# Patient Record
Sex: Female | Born: 1970 | Race: White | Hispanic: No | Marital: Married | Smoking: Current every day smoker
Health system: Southern US, Community
[De-identification: ages and names within clinical notes are randomized; demographics above are authoritative.]

## PROBLEM LIST (undated history)

## (undated) DIAGNOSIS — M810 Age-related osteoporosis without current pathological fracture: Secondary | ICD-10-CM

## (undated) DIAGNOSIS — F319 Bipolar disorder, unspecified: Secondary | ICD-10-CM

## (undated) DIAGNOSIS — F329 Major depressive disorder, single episode, unspecified: Secondary | ICD-10-CM

## (undated) DIAGNOSIS — C801 Malignant (primary) neoplasm, unspecified: Secondary | ICD-10-CM

## (undated) DIAGNOSIS — M5030 Other cervical disc degeneration, unspecified cervical region: Secondary | ICD-10-CM

## (undated) DIAGNOSIS — J449 Chronic obstructive pulmonary disease, unspecified: Secondary | ICD-10-CM

## (undated) DIAGNOSIS — M797 Fibromyalgia: Secondary | ICD-10-CM

## (undated) DIAGNOSIS — Z9889 Other specified postprocedural states: Secondary | ICD-10-CM

## (undated) DIAGNOSIS — I10 Essential (primary) hypertension: Secondary | ICD-10-CM

## (undated) DIAGNOSIS — F32A Depression, unspecified: Secondary | ICD-10-CM

## (undated) HISTORY — PX: ABDOMINAL HYSTERECTOMY: SHX81

## (undated) HISTORY — PX: SKIN BIOPSY: SHX1

## (undated) HISTORY — PX: CHOLECYSTECTOMY: SHX55

## (undated) HISTORY — PX: BACK SURGERY: SHX140

---

## 2000-06-03 ENCOUNTER — Inpatient Hospital Stay (HOSPITAL_COMMUNITY): Admission: EM | Admit: 2000-06-03 | Discharge: 2000-06-06 | Payer: Self-pay | Admitting: *Deleted

## 2001-11-04 ENCOUNTER — Emergency Department (HOSPITAL_COMMUNITY): Admission: EM | Admit: 2001-11-04 | Discharge: 2001-11-04 | Payer: Self-pay | Admitting: Emergency Medicine

## 2003-01-25 ENCOUNTER — Encounter: Payer: Self-pay | Admitting: General Surgery

## 2003-01-26 ENCOUNTER — Encounter: Payer: Self-pay | Admitting: General Surgery

## 2003-01-26 ENCOUNTER — Inpatient Hospital Stay (HOSPITAL_COMMUNITY): Admission: AC | Admit: 2003-01-26 | Discharge: 2003-01-30 | Payer: Self-pay

## 2003-01-27 ENCOUNTER — Encounter: Payer: Self-pay | Admitting: General Surgery

## 2003-01-28 ENCOUNTER — Encounter: Payer: Self-pay | Admitting: General Surgery

## 2003-06-17 ENCOUNTER — Encounter: Payer: Self-pay | Admitting: Emergency Medicine

## 2003-06-17 ENCOUNTER — Emergency Department (HOSPITAL_COMMUNITY): Admission: EM | Admit: 2003-06-17 | Discharge: 2003-06-17 | Payer: Self-pay | Admitting: Obstetrics and Gynecology

## 2004-01-13 ENCOUNTER — Ambulatory Visit (HOSPITAL_COMMUNITY): Admission: RE | Admit: 2004-01-13 | Discharge: 2004-01-13 | Payer: Self-pay | Admitting: Family Medicine

## 2006-06-25 ENCOUNTER — Emergency Department (HOSPITAL_COMMUNITY): Admission: EM | Admit: 2006-06-25 | Discharge: 2006-06-25 | Payer: Self-pay | Admitting: Emergency Medicine

## 2006-06-27 ENCOUNTER — Emergency Department (HOSPITAL_COMMUNITY): Admission: EM | Admit: 2006-06-27 | Discharge: 2006-06-27 | Payer: Self-pay | Admitting: Emergency Medicine

## 2006-06-29 ENCOUNTER — Emergency Department (HOSPITAL_COMMUNITY): Admission: EM | Admit: 2006-06-29 | Discharge: 2006-06-29 | Payer: Self-pay | Admitting: Emergency Medicine

## 2006-10-07 ENCOUNTER — Inpatient Hospital Stay (HOSPITAL_COMMUNITY): Admission: EM | Admit: 2006-10-07 | Discharge: 2006-10-09 | Payer: Self-pay | Admitting: Emergency Medicine

## 2007-02-09 ENCOUNTER — Emergency Department (HOSPITAL_COMMUNITY): Admission: EM | Admit: 2007-02-09 | Discharge: 2007-02-09 | Payer: Self-pay | Admitting: Emergency Medicine

## 2007-02-15 ENCOUNTER — Ambulatory Visit: Payer: Self-pay | Admitting: Internal Medicine

## 2007-03-20 ENCOUNTER — Ambulatory Visit: Payer: Self-pay | Admitting: Internal Medicine

## 2007-05-22 ENCOUNTER — Encounter: Payer: Self-pay | Admitting: Obstetrics & Gynecology

## 2007-05-22 ENCOUNTER — Observation Stay (HOSPITAL_COMMUNITY): Admission: RE | Admit: 2007-05-22 | Discharge: 2007-05-23 | Payer: Self-pay | Admitting: Obstetrics & Gynecology

## 2007-12-26 ENCOUNTER — Encounter: Admission: RE | Admit: 2007-12-26 | Discharge: 2007-12-26 | Payer: Self-pay | Admitting: Neurosurgery

## 2008-01-30 ENCOUNTER — Encounter (HOSPITAL_COMMUNITY): Admission: RE | Admit: 2008-01-30 | Discharge: 2008-02-29 | Payer: Self-pay | Admitting: Neurosurgery

## 2008-04-06 ENCOUNTER — Emergency Department (HOSPITAL_COMMUNITY): Admission: EM | Admit: 2008-04-06 | Discharge: 2008-04-06 | Payer: Self-pay | Admitting: Emergency Medicine

## 2008-06-23 ENCOUNTER — Inpatient Hospital Stay (HOSPITAL_COMMUNITY): Admission: EM | Admit: 2008-06-23 | Discharge: 2008-06-24 | Payer: Self-pay | Admitting: Emergency Medicine

## 2008-10-28 ENCOUNTER — Emergency Department (HOSPITAL_COMMUNITY): Admission: EM | Admit: 2008-10-28 | Discharge: 2008-10-28 | Payer: Self-pay | Admitting: Emergency Medicine

## 2009-03-06 ENCOUNTER — Inpatient Hospital Stay (HOSPITAL_COMMUNITY): Admission: AD | Admit: 2009-03-06 | Discharge: 2009-03-09 | Payer: Self-pay | Admitting: Internal Medicine

## 2009-03-06 ENCOUNTER — Ambulatory Visit: Payer: Self-pay | Admitting: Internal Medicine

## 2009-04-16 ENCOUNTER — Emergency Department (HOSPITAL_COMMUNITY): Admission: EM | Admit: 2009-04-16 | Discharge: 2009-04-16 | Payer: Self-pay | Admitting: Emergency Medicine

## 2009-05-19 ENCOUNTER — Emergency Department (HOSPITAL_COMMUNITY): Admission: EM | Admit: 2009-05-19 | Discharge: 2009-05-19 | Payer: Self-pay | Admitting: Emergency Medicine

## 2009-05-30 ENCOUNTER — Emergency Department (HOSPITAL_COMMUNITY): Admission: EM | Admit: 2009-05-30 | Discharge: 2009-05-30 | Payer: Self-pay | Admitting: Emergency Medicine

## 2009-09-17 ENCOUNTER — Ambulatory Visit (HOSPITAL_COMMUNITY): Admission: RE | Admit: 2009-09-17 | Discharge: 2009-09-17 | Payer: Self-pay | Admitting: Internal Medicine

## 2009-11-26 ENCOUNTER — Encounter: Payer: Self-pay | Admitting: Internal Medicine

## 2009-12-13 ENCOUNTER — Ambulatory Visit (HOSPITAL_COMMUNITY): Admission: RE | Admit: 2009-12-13 | Discharge: 2009-12-13 | Payer: Self-pay | Admitting: Neurology

## 2010-06-09 ENCOUNTER — Ambulatory Visit: Payer: Self-pay | Admitting: Cardiology

## 2010-06-15 ENCOUNTER — Ambulatory Visit (HOSPITAL_COMMUNITY): Payer: Self-pay | Admitting: Oncology

## 2010-06-15 ENCOUNTER — Encounter (HOSPITAL_COMMUNITY)
Admission: RE | Admit: 2010-06-15 | Discharge: 2010-07-15 | Payer: Self-pay | Source: Home / Self Care | Admitting: Oncology

## 2010-06-16 ENCOUNTER — Ambulatory Visit (HOSPITAL_COMMUNITY): Payer: Self-pay | Admitting: Oncology

## 2010-06-20 ENCOUNTER — Inpatient Hospital Stay (HOSPITAL_COMMUNITY): Admission: EM | Admit: 2010-06-20 | Discharge: 2010-06-23 | Payer: Self-pay | Admitting: Emergency Medicine

## 2010-06-27 ENCOUNTER — Ambulatory Visit (HOSPITAL_COMMUNITY): Admission: RE | Admit: 2010-06-27 | Discharge: 2010-06-27 | Payer: Self-pay | Admitting: Oncology

## 2010-07-25 ENCOUNTER — Ambulatory Visit (HOSPITAL_COMMUNITY): Admission: RE | Admit: 2010-07-25 | Discharge: 2010-07-25 | Payer: Self-pay | Admitting: Neurology

## 2010-09-13 ENCOUNTER — Ambulatory Visit (HOSPITAL_COMMUNITY): Payer: Self-pay | Admitting: Oncology

## 2010-09-13 ENCOUNTER — Encounter (HOSPITAL_COMMUNITY)
Admission: RE | Admit: 2010-09-13 | Discharge: 2010-10-13 | Payer: Self-pay | Source: Home / Self Care | Attending: Oncology | Admitting: Oncology

## 2010-09-29 ENCOUNTER — Ambulatory Visit
Admission: RE | Admit: 2010-09-29 | Discharge: 2010-09-29 | Payer: Self-pay | Source: Home / Self Care | Attending: Neurology | Admitting: Neurology

## 2010-11-06 ENCOUNTER — Encounter (HOSPITAL_COMMUNITY): Payer: Self-pay | Admitting: Oncology

## 2010-11-07 ENCOUNTER — Ambulatory Visit: Admit: 2010-11-07 | Payer: Self-pay | Admitting: Internal Medicine

## 2010-11-15 NOTE — Letter (Signed)
Summary: DISABILITY CLAIM  DISABILITY CLAIM   Imported By: Diana Eves 11/26/2009 15:10:24  _____________________________________________________________________  External Attachment:    Type:   Image     Comment:   External Document

## 2010-12-28 ENCOUNTER — Emergency Department (HOSPITAL_COMMUNITY)
Admission: EM | Admit: 2010-12-28 | Discharge: 2010-12-28 | Disposition: A | Discharge status: Home / Self Care | Payer: Self-pay | Attending: Emergency Medicine | Admitting: Emergency Medicine

## 2010-12-28 DIAGNOSIS — R110 Nausea: Secondary | ICD-10-CM | POA: Insufficient documentation

## 2010-12-28 DIAGNOSIS — R21 Rash and other nonspecific skin eruption: Secondary | ICD-10-CM | POA: Insufficient documentation

## 2010-12-28 DIAGNOSIS — L089 Local infection of the skin and subcutaneous tissue, unspecified: Secondary | ICD-10-CM | POA: Insufficient documentation

## 2010-12-28 DIAGNOSIS — R197 Diarrhea, unspecified: Secondary | ICD-10-CM | POA: Insufficient documentation

## 2010-12-28 DIAGNOSIS — Z79899 Other long term (current) drug therapy: Secondary | ICD-10-CM | POA: Insufficient documentation

## 2010-12-29 LAB — CBC
HCT: 39 % (ref 36.0–46.0)
HCT: 39.6 % (ref 36.0–46.0)
HCT: 41.1 % (ref 36.0–46.0)
Hemoglobin: 12.9 g/dL (ref 12.0–15.0)
Hemoglobin: 13.5 g/dL (ref 12.0–15.0)
Hemoglobin: 13.9 g/dL (ref 12.0–15.0)
MCH: 32.2 pg (ref 26.0–34.0)
MCH: 32.3 pg (ref 26.0–34.0)
MCH: 32.5 pg (ref 26.0–34.0)
MCHC: 33.6 g/dL (ref 30.0–36.0)
MCHC: 33.8 g/dL (ref 30.0–36.0)
MCHC: 34.1 g/dL (ref 30.0–36.0)
MCHC: 34.5 g/dL (ref 30.0–36.0)
MCV: 94.5 fL (ref 78.0–100.0)
MCV: 95.5 fL (ref 78.0–100.0)
Platelets: 274 10*3/uL (ref 150–400)
Platelets: 284 K/uL (ref 150–400)
RBC: 3.96 MIL/uL (ref 3.87–5.11)
RBC: 4.19 MIL/uL (ref 3.87–5.11)
RDW: 14.2 % (ref 11.5–15.5)
RDW: 14.4 % (ref 11.5–15.5)
RDW: 14.5 % (ref 11.5–15.5)
RDW: 14.7 % (ref 11.5–15.5)
WBC: 10.1 10*3/uL (ref 4.0–10.5)
WBC: 10.9 K/uL — ABNORMAL HIGH (ref 4.0–10.5)
WBC: 11.1 10*3/uL — ABNORMAL HIGH (ref 4.0–10.5)

## 2010-12-29 LAB — BLOOD GAS, ARTERIAL
Acid-Base Excess: 8.4 mmol/L — ABNORMAL HIGH (ref 0.0–2.0)
Bicarbonate: 33.5 meq/L — ABNORMAL HIGH (ref 20.0–24.0)
FIO2: 0.21 %
O2 Saturation: 92.3 %
Patient temperature: 37
TCO2: 30 mmol/L (ref 0–100)
pCO2 arterial: 56.7 mmHg — ABNORMAL HIGH (ref 35.0–45.0)
pH, Arterial: 7.389 (ref 7.350–7.400)
pO2, Arterial: 58 mmHg — ABNORMAL LOW (ref 80.0–100.0)

## 2010-12-29 LAB — DIFFERENTIAL
Basophils Absolute: 0.1 10*3/uL (ref 0.0–0.1)
Basophils Absolute: 0.1 10*3/uL (ref 0.0–0.1)
Basophils Absolute: 0.2 K/uL — ABNORMAL HIGH (ref 0.0–0.1)
Basophils Relative: 1 % (ref 0–1)
Basophils Relative: 1 % (ref 0–1)
Basophils Relative: 2 % — ABNORMAL HIGH (ref 0–1)
Eosinophils Absolute: 0 10*3/uL (ref 0.0–0.7)
Eosinophils Absolute: 0.5 K/uL (ref 0.0–0.7)
Eosinophils Relative: 5 % (ref 0–5)
Eosinophils Relative: 5 % (ref 0–5)
Eosinophils Relative: 5 % (ref 0–5)
Lymphocytes Relative: 19 % (ref 12–46)
Lymphocytes Relative: 41 % (ref 12–46)
Lymphocytes Relative: 41 % (ref 12–46)
Lymphocytes Relative: 52 % — ABNORMAL HIGH (ref 12–46)
Lymphs Abs: 2.1 10*3/uL (ref 0.7–4.0)
Lymphs Abs: 3.8 10*3/uL (ref 0.7–4.0)
Lymphs Abs: 4.4 K/uL — ABNORMAL HIGH (ref 0.7–4.0)
Monocytes Absolute: 0.4 10*3/uL (ref 0.1–1.0)
Monocytes Absolute: 0.5 10*3/uL (ref 0.1–1.0)
Monocytes Absolute: 0.8 10*3/uL (ref 0.1–1.0)
Monocytes Relative: 3 % (ref 3–12)
Monocytes Relative: 5 % (ref 3–12)
Monocytes Relative: 6 % (ref 3–12)
Neutro Abs: 3.6 10*3/uL (ref 1.7–7.7)
Neutro Abs: 4.4 10*3/uL (ref 1.7–7.7)
Neutro Abs: 5.3 10*3/uL (ref 1.7–7.7)
Neutrophils Relative %: 48 % (ref 43–77)
Neutrophils Relative %: 49 % (ref 43–77)
Neutrophils Relative %: 75 % (ref 43–77)

## 2010-12-29 LAB — HIV ANTIBODY (ROUTINE TESTING W REFLEX): HIV: NONREACTIVE

## 2010-12-29 LAB — URINALYSIS, ROUTINE W REFLEX MICROSCOPIC
Nitrite: POSITIVE — AB
Specific Gravity, Urine: <1.005 — ABNORMAL LOW (ref 1.005–1.030)
pH: 6.5 (ref 5.0–8.0)

## 2010-12-29 LAB — BASIC METABOLIC PANEL
BUN: 7 mg/dL (ref 6–23)
BUN: 8 mg/dL (ref 6–23)
CO2: 31 mEq/L (ref 19–32)
CO2: 34 mEq/L — ABNORMAL HIGH (ref 19–32)
Calcium: 9 mg/dL (ref 8.4–10.5)
Chloride: 101 mEq/L (ref 96–112)
Chloride: 98 mEq/L (ref 96–112)
Chloride: 99 mEq/L (ref 96–112)
Creatinine, Ser: 0.57 mg/dL (ref 0.4–1.2)
GFR calc Af Amer: >60 mL/min (ref >60)
GFR calc non Af Amer: >60 mL/min (ref >60)
GFR calc non Af Amer: >60 mL/min (ref >60)
GFR calc non Af Amer: >60 mL/min (ref >60)
Glucose, Bld: 93 mg/dL (ref 70–99)
Glucose, Bld: 93 mg/dL (ref 70–99)
Glucose, Bld: 96 mg/dL (ref 70–99)
Potassium: 4.3 mEq/L (ref 3.5–5.1)
Potassium: 4.8 mEq/L (ref 3.5–5.1)
Sodium: 138 mEq/L (ref 135–145)
Sodium: 139 mEq/L (ref 135–145)

## 2010-12-29 LAB — BASIC METABOLIC PANEL WITH GFR
BUN: 9 mg/dL (ref 6–23)
Calcium: 9.3 mg/dL (ref 8.4–10.5)
Creatinine, Ser: 0.6 mg/dL (ref 0.4–1.2)
GFR calc Af Amer: >60 mL/min (ref >60)

## 2010-12-29 LAB — CULTURE, BLOOD (ROUTINE X 2)
Culture: NO GROWTH
Culture: NO GROWTH
Report Status: 9092011
Report Status: 9092011

## 2010-12-29 LAB — BETA 2 MICROGLOBULIN, SERUM: Beta-2 Microglobulin: 1.85 mg/L — ABNORMAL HIGH (ref 1.01–1.73)

## 2010-12-29 LAB — LIPASE, BLOOD: Lipase: 38 U/L (ref 11–59)

## 2010-12-29 LAB — COMPREHENSIVE METABOLIC PANEL
ALT: 93 U/L — ABNORMAL HIGH (ref 0–35)
AST: 36 U/L (ref 0–37)
Alkaline Phosphatase: 74 U/L (ref 39–117)
CO2: 26 mEq/L (ref 19–32)
CO2: 34 mEq/L — ABNORMAL HIGH (ref 19–32)
Calcium: 9 mg/dL (ref 8.4–10.5)
Calcium: 9.1 mg/dL (ref 8.4–10.5)
Chloride: 101 mEq/L (ref 96–112)
Creatinine, Ser: 0.76 mg/dL (ref 0.4–1.2)
GFR calc non Af Amer: >60 mL/min (ref >60)
GFR calc non Af Amer: >60 mL/min (ref >60)
Glucose, Bld: 107 mg/dL — ABNORMAL HIGH (ref 70–99)
Glucose, Bld: 112 mg/dL — ABNORMAL HIGH (ref 70–99)
Potassium: 3.9 mEq/L (ref 3.5–5.1)
Sodium: 138 mEq/L (ref 135–145)
Sodium: 143 mEq/L (ref 135–145)
Total Bilirubin: 0.2 mg/dL — ABNORMAL LOW (ref 0.3–1.2)
Total Protein: 6.6 g/dL (ref 6.0–8.3)

## 2010-12-29 LAB — SEDIMENTATION RATE: Sed Rate: 5 mm/hr (ref 0–22)

## 2010-12-29 LAB — URINE CULTURE: Culture: NO GROWTH

## 2010-12-29 LAB — HEPATIC FUNCTION PANEL
Bilirubin, Direct: 0.1 mg/dL (ref 0.0–0.3)
Total Bilirubin: 0.4 mg/dL (ref 0.3–1.2)

## 2010-12-29 LAB — MRSA PCR SCREENING: MRSA by PCR: POSITIVE — AB

## 2010-12-29 LAB — STREP A DNA PROBE: Group A Strep Probe: NEGATIVE

## 2010-12-29 LAB — EPSTEIN-BARR VIRUS VCA, IGM: EBV VCA IgM: 0.09 {ISR}

## 2010-12-29 LAB — EPSTEIN-BARR VIRUS VCA, IGG: EBV VCA IgG: 7.03 {ISR} — ABNORMAL HIGH

## 2010-12-29 LAB — LACTIC ACID, PLASMA: Lactic Acid, Venous: 1.1 mmol/L (ref 0.5–2.2)

## 2010-12-29 LAB — PROCALCITONIN: Procalcitonin: <0.1 ng/mL

## 2010-12-29 LAB — LACTATE DEHYDROGENASE: LDH: 133 U/L (ref 94–250)

## 2010-12-29 LAB — URINE MICROSCOPIC-ADD ON

## 2010-12-29 LAB — RAPID STREP SCREEN (MED CTR MEBANE ONLY): Streptococcus, Group A Screen (Direct): NEGATIVE

## 2010-12-29 LAB — AMMONIA: Ammonia: 30 umol/L (ref 11–35)

## 2010-12-29 LAB — GLUCOSE, CAPILLARY: Glucose-Capillary: 105 mg/dL — ABNORMAL HIGH (ref 70–99)

## 2011-01-21 LAB — DIFFERENTIAL
Basophils Absolute: 0.1 10*3/uL (ref 0.0–0.1)
Lymphocytes Relative: 45 % (ref 12–46)
Lymphs Abs: 5.1 10*3/uL — ABNORMAL HIGH (ref 0.7–4.0)
Neutro Abs: 4.9 10*3/uL (ref 1.7–7.7)

## 2011-01-21 LAB — CBC
HCT: 38.8 % (ref 36.0–46.0)
Platelets: 310 10*3/uL (ref 150–400)
RDW: 14.2 % (ref 11.5–15.5)
WBC: 11.4 10*3/uL — ABNORMAL HIGH (ref 4.0–10.5)

## 2011-01-21 LAB — BASIC METABOLIC PANEL
BUN: 13 mg/dL (ref 6–23)
Calcium: 9 mg/dL (ref 8.4–10.5)
GFR calc non Af Amer: >60 mL/min (ref >60)
Glucose, Bld: 96 mg/dL (ref 70–99)
Potassium: 3.6 mEq/L (ref 3.5–5.1)
Sodium: 135 mEq/L (ref 135–145)

## 2011-01-22 LAB — CBC
HCT: 40.4 % (ref 36.0–46.0)
Hemoglobin: 14.3 g/dL (ref 12.0–15.0)
Platelets: 314 10*3/uL (ref 150–400)
RBC: 4.34 MIL/uL (ref 3.87–5.11)
WBC: 9.7 10*3/uL (ref 4.0–10.5)

## 2011-01-22 LAB — BASIC METABOLIC PANEL
BUN: 6 mg/dL (ref 6–23)
Chloride: 101 mEq/L (ref 96–112)
GFR calc Af Amer: >60 mL/min (ref >60)
GFR calc non Af Amer: >60 mL/min (ref >60)
Potassium: 3.7 mEq/L (ref 3.5–5.1)
Sodium: 139 mEq/L (ref 135–145)

## 2011-01-22 LAB — URINALYSIS, ROUTINE W REFLEX MICROSCOPIC
Glucose, UA: NEGATIVE mg/dL
Nitrite: NEGATIVE
Specific Gravity, Urine: 1.01 (ref 1.005–1.030)
pH: 6 (ref 5.0–8.0)

## 2011-01-22 LAB — DIFFERENTIAL
Eosinophils Relative: 5 % (ref 0–5)
Lymphocytes Relative: 38 % (ref 12–46)
Lymphs Abs: 3.7 10*3/uL (ref 0.7–4.0)
Monocytes Relative: 7 % (ref 3–12)

## 2011-01-24 LAB — GLUCOSE, CAPILLARY: Glucose-Capillary: 104 mg/dL — ABNORMAL HIGH (ref 70–99)

## 2011-01-24 LAB — COMPREHENSIVE METABOLIC PANEL
ALT: 115 U/L — ABNORMAL HIGH (ref 0–35)
ALT: 242 U/L — ABNORMAL HIGH (ref 0–35)
AST: 29 U/L (ref 0–37)
AST: 56 U/L — ABNORMAL HIGH (ref 0–37)
Albumin: 3.3 g/dL — ABNORMAL LOW (ref 3.5–5.2)
BUN: 4 mg/dL — ABNORMAL LOW (ref 6–23)
CO2: 30 mEq/L (ref 19–32)
CO2: 30 mEq/L (ref 19–32)
CO2: 32 mEq/L (ref 19–32)
Calcium: 8.8 mg/dL (ref 8.4–10.5)
Calcium: 8.9 mg/dL (ref 8.4–10.5)
Calcium: 9.2 mg/dL (ref 8.4–10.5)
Calcium: 9.3 mg/dL (ref 8.4–10.5)
Chloride: 103 mEq/L (ref 96–112)
Chloride: 104 mEq/L (ref 96–112)
Chloride: 106 mEq/L (ref 96–112)
Creatinine, Ser: 0.62 mg/dL (ref 0.4–1.2)
Creatinine, Ser: 0.63 mg/dL (ref 0.4–1.2)
Creatinine, Ser: 0.64 mg/dL (ref 0.4–1.2)
Creatinine, Ser: 0.69 mg/dL (ref 0.4–1.2)
GFR calc Af Amer: >60 mL/min (ref >60)
GFR calc Af Amer: >60 mL/min (ref >60)
GFR calc Af Amer: >60 mL/min (ref >60)
GFR calc non Af Amer: >60 mL/min (ref >60)
GFR calc non Af Amer: >60 mL/min (ref >60)
GFR calc non Af Amer: >60 mL/min (ref >60)
Glucose, Bld: 100 mg/dL — ABNORMAL HIGH (ref 70–99)
Glucose, Bld: 112 mg/dL — ABNORMAL HIGH (ref 70–99)
Glucose, Bld: 123 mg/dL — ABNORMAL HIGH (ref 70–99)
Sodium: 142 mEq/L (ref 135–145)
Total Bilirubin: 0.6 mg/dL (ref 0.3–1.2)
Total Bilirubin: 0.7 mg/dL (ref 0.3–1.2)

## 2011-01-24 LAB — MAGNESIUM: Magnesium: 1.9 mg/dL (ref 1.5–2.5)

## 2011-01-24 LAB — TSH: TSH: 0.805 u[IU]/mL (ref 0.350–4.500)

## 2011-01-24 LAB — CBC
HCT: 39.5 % (ref 36.0–46.0)
HCT: 39.8 % (ref 36.0–46.0)
Hemoglobin: 13.5 g/dL (ref 12.0–15.0)
Hemoglobin: 13.6 g/dL (ref 12.0–15.0)
MCHC: 34.1 g/dL (ref 30.0–36.0)
MCHC: 34.1 g/dL (ref 30.0–36.0)
MCHC: 34.2 g/dL (ref 30.0–36.0)
MCHC: 34.4 g/dL (ref 30.0–36.0)
MCV: 93.3 fL (ref 78.0–100.0)
MCV: 93.8 fL (ref 78.0–100.0)
MCV: 93.9 fL (ref 78.0–100.0)
MCV: 94 fL (ref 78.0–100.0)
Platelets: 306 10*3/uL (ref 150–400)
Platelets: 326 10*3/uL (ref 150–400)
RBC: 4.21 MIL/uL (ref 3.87–5.11)
RBC: 4.22 MIL/uL (ref 3.87–5.11)
RBC: 4.26 MIL/uL (ref 3.87–5.11)
RBC: 4.4 MIL/uL (ref 3.87–5.11)
WBC: 10.5 10*3/uL (ref 4.0–10.5)
WBC: 8.4 10*3/uL (ref 4.0–10.5)
WBC: 9.6 10*3/uL (ref 4.0–10.5)

## 2011-01-24 LAB — URINE CULTURE: Colony Count: 25000

## 2011-01-24 LAB — HEMOGLOBIN A1C
Hgb A1c MFr Bld: 5.5 % (ref 4.6–6.1)
Mean Plasma Glucose: 111 mg/dL

## 2011-01-24 LAB — URINALYSIS, ROUTINE W REFLEX MICROSCOPIC
Bilirubin Urine: NEGATIVE
Hgb urine dipstick: NEGATIVE
Ketones, ur: 15 mg/dL — AB
Nitrite: NEGATIVE
Protein, ur: NEGATIVE mg/dL
Specific Gravity, Urine: 1.006 (ref 1.005–1.030)
Urobilinogen, UA: 0.2 mg/dL (ref 0.0–1.0)

## 2011-01-24 LAB — IRON AND TIBC: TIBC: 334 ug/dL (ref 250–470)

## 2011-01-24 LAB — T4, FREE: Free T4: 1.01 ng/dL (ref 0.80–1.80)

## 2011-01-24 LAB — HEPATITIS PANEL, ACUTE: HCV Ab: NEGATIVE

## 2011-01-24 LAB — FERRITIN: Ferritin: 45 ng/mL (ref 10–291)

## 2011-01-24 LAB — GAMMA GT: GGT: 268 U/L — ABNORMAL HIGH (ref 7–51)

## 2011-01-30 LAB — COMPREHENSIVE METABOLIC PANEL
ALT: 124 U/L — ABNORMAL HIGH (ref 0–35)
AST: 26 U/L (ref 0–37)
Albumin: 4 g/dL (ref 3.5–5.2)
Chloride: 101 mEq/L (ref 96–112)
Creatinine, Ser: 0.63 mg/dL (ref 0.4–1.2)
GFR calc Af Amer: >60 mL/min (ref >60)
Potassium: 3.7 mEq/L (ref 3.5–5.1)
Sodium: 138 mEq/L (ref 135–145)
Total Bilirubin: 0.5 mg/dL (ref 0.3–1.2)

## 2011-01-30 LAB — URINALYSIS, ROUTINE W REFLEX MICROSCOPIC
Bilirubin Urine: NEGATIVE
Glucose, UA: NEGATIVE mg/dL
Specific Gravity, Urine: 1.02 (ref 1.005–1.030)
pH: 7 (ref 5.0–8.0)

## 2011-01-30 LAB — CBC
MCV: 92.3 fL (ref 78.0–100.0)
Platelets: 358 10*3/uL (ref 150–400)
RBC: 4.71 MIL/uL (ref 3.87–5.11)
WBC: 9.8 10*3/uL (ref 4.0–10.5)

## 2011-01-30 LAB — DIFFERENTIAL
Basophils Absolute: 0.1 10*3/uL (ref 0.0–0.1)
Eosinophils Absolute: 0.2 10*3/uL (ref 0.0–0.7)
Eosinophils Relative: 2 % (ref 0–5)
Lymphocytes Relative: 34 % (ref 12–46)
Monocytes Absolute: 0.8 10*3/uL (ref 0.1–1.0)

## 2011-01-30 LAB — URINE MICROSCOPIC-ADD ON

## 2011-02-28 NOTE — Assessment & Plan Note (Signed)
Brittany, Carrillo                CHART#:  [id number]   DATE:  03/20/2007                       DOB:  1971/09/26   PRESENTING COMPLAINT:  Follow-up for abdominal bloating, constipation  and elevated transaminases.   SUBJECTIVE:  Brittany Carrillo is a 40 year old Caucasian female who is here for  scheduled visit.  She was last seen on 02/15/2007 for multiple GI  symptoms.  She was felt to have IBS, GERD.  She was begun on MiraLax,  fiber supplement and switched from __________ to [employer].  She was also  given a prescription for omeprazole.   We requested records from June 13, 2000 when she had EGD and a  colonoscopy by Dr. Jerolyn Shin C. Smith.  He said that she had superficial 2  ulcers in the interim and she had limited proctitis.  Apparently she had  been on NSAID's at the time. It is unclear whether or not she has had H.  Pylori serology.   The patient states that she is feeling some better.  Her biggest  complaint is bloating which is worse as the day progresses.  She also  has nausea without vomiting and does not have a good appetite.  She  continues to experience sharp pain in the mid and upper abdomen which  occurs once or twice a week and radiates posteriorly.  She stays tired.  She still has irregular bowel movements but doing much better than she  was before.  She may go a couple of days with a normal bowel movement  and then may skip a day or 2.  She denies melena or rectal bleeding.  She has gained 15 pounds in a year and she believes that she has gained  over 30 pounds in 5 years.  She states that her bipolar disorder is  being helped by Cymbalta.  She continues to experience premenstrual and  menstrual pain.  She says that she uses ibuprofen regularly when she has  these symptoms and she says that she has to take at least 800 mg at a  time.   She is on Cymbalta 60 mg q. day, dicyclomine 10 mg t.i.d., omeprazole 20  mg q. a.m. and MiraLax 17 g q. day.   OBJECTIVE:  Weight 190  pounds.  She is 5 feet 7 inches tall. Pulse is 88  per minute.  Blood pressure is 130/80.  Temperature is 98.2.  Conjunctivae is pink.  Sclerae is anicteric.  Oral pharyngeal mucosa is  normal.  No neck masses or thyromegaly noted.  Her abdomen is protuberant.  Bowel sounds are normal. On palpation, soft  but mild tenderness which is more or less generalized.  No organomegaly  or masses noted.  She does not have peripheral edema or clubbing.   Labs initially done on Mar 12, 2007.  There was some mix up and we got  results back with the name of Brittany Carrillo.   The patient was therefore advised to repeat LFT's which were done on Mar 15, 2007 with correct name and date of birth.  Her bili was 0.3, AP 78,  AFT 30, ALT 234.  Total protein 6.5, albumin 3.7.   Her LFT's from January when she was hospitalized were abnormal.  Her AFT  ranged from 216 on admission down to 21 prior to discharge and ALT  was  107 and dropped 44.  Her hepatitis B surface antigen, hepatitis antibody  IgM and hepatitis C antibody were negative.   She also had glucose levels ranging between 106 and 143.   ASSESSMENT:  1. Abdominal bloating and pain:  I suspect most of these symptoms are      due to irritable bowel syndrome.  Since bloating appears to be      intractable and has not responded to dietary measures a trial with      antibiotic would be reasonable.  2. Elevated transaminases:  Recent LFT's reveal a normal AFT but ALT      of 234.  I suspect she has a fatty liver although at this time a      pattern with AFT of 30 and ALT 34 is intriguing and her LFT's need      to be repeated.  3. Premenstrual pain:  The patient is advised not to use high dose      ibuprofen.   PLAN:  The patient will continue dicyclomine and omeprazole.  A 10-day trial with __________ 400 mg t.i.d. samples given.  The patient is advised to make an appointment to see Dr. __________  to  consider other options for her pelvic  pain/menstrual cramps other than  ibuprofen.   She will have LFT's repeated along with fasting blood glucose, sed rate  and ANA and we will also free some serum in case for the studies needed.   The patient is advised to start walking regularly with the hope of  getting her weight down.       Lionel December, M.D.  Electronically Signed     NR/MEDQ  D:  03/20/2007  T:  03/21/2007  Job:  [id number]   cc:   ?

## 2011-02-28 NOTE — Discharge Summary (Signed)
Brittany Carrillo, Brittany Carrillo               ACCOUNT NO.:  [id number]   MEDICAL RECORD NO.:  [id number]          PATIENT TYPE:  INP   LOCATION:  A335                          FACILITY:  [facility]   PHYSICIAN:  Osvaldo Shipper, MD     DATE OF BIRTH:  Jun 21, 1971   DATE OF ADMISSION:  06/23/2008  DATE OF DISCHARGE:  09/09/2009LH                               DISCHARGE SUMMARY   PRIMARY MEDICAL DOCTOR:  Dr. Loney Hering in [place], [place].   DISCHARGE DIAGNOSES:  1. Acute bronchitis, improved.  2. History of bipolar disorder.  3. Tobacco abuse.   Please review H&P dictated by Dr. Elige Radon for details regarding the  patient's presenting illness.   BRIEF HOSPITAL COURSE:  Briefly, this is a 40 year old Caucasian female  who presented to the hospital with cough and shortness of breath.  She  was found to have significant wheezing on her examination.  No pneumonis  noted on CXR.  It did show, however, findings of chronic bronchitis.  The patient was started on nebulizer treatments, steroids.  Overnight,  she has significantly improved.  She is saturating 94% on room air.  This morning, the patient is very keen on going home.  She says she is  starting a new job tomorrow and is supposed to go for an orientation in  the morning.   PHYSICAL EXAMINATION:  LUNGS:  Clear to auscultation.  No wheezing  appreciated.  CARDIOVASCULAR:  Also unremarkable.  The rest of the exam is benign.  VITAL SIGNS:  All stable otherwise.  She has been afebrile.   The rest of the medical issues, including bipolar disorder are stable.  Tobacco cessation counseling was provided to the patient.  I have  explained to her that smoking can worsen her lung function.  I have also  explained to the patient that she may benefit from a pulmonary function  test, but she needs to discuss this with her PMD.  Since the patient is  keen on going home and since she has shown improvement, I think she can  be discharged.   DISCHARGE  MEDICATIONS:  1. Prednisone taper 40 mg daily for 3 days, followed by 20 daily for 3      days, followed by 10 mg daily for 3 days.  2. Z-Pak 1 dose pack.  3. Albuterol inhaler 2 puffs every 4 hours as needed for wheezing.  4. She may continue her home medications which include:  Cymbalta 60      mg daily, Seroquel 300 mg daily, Depakote 500 mg b.i.d., Xanax 1 mg      at bedtime, Flexeril 20 mg at bedtime, ibuprofen as needed.   FOLLOW UP:  Dr. Loney Hering within a week.   DIET:  No restrictions.   PHYSICAL ACTIVITY:  No restrictions.   Total time of this discharge encounter 35 minutes.      Osvaldo Shipper, MD  Electronically Signed     GK/MEDQ  D:  06/24/2008  T:  06/24/2008  Job:  [id number]

## 2011-02-28 NOTE — Op Note (Signed)
Brittany Carrillo, Brittany Carrillo               ACCOUNT NO.:  [id number]   MEDICAL RECORD NO.:  [id number]          PATIENT TYPE:  OBV   LOCATION:  A304                          FACILITY:  [facility]   PHYSICIAN:  [name], M.D.   DATE OF BIRTH:  17-Sep-1971   DATE OF PROCEDURE:  05/22/2007  DATE OF DISCHARGE:  05/23/2007                               OPERATIVE REPORT   PREOPERATIVE DIAGNOSES:  1. Menometrorrhagia.  2. Dysmenorrhea.  3. Dyspareunia.   POSTOPERATIVE DIAGNOSES:  1. Menometrorrhagia.  2. Dysmenorrhea.  3. Dyspareunia.   PROCEDURE:  Total vaginal hysterectomy/bilateral salpingo-oophorectomy.   SURGEON:  [name], M.D.   ANESTHESIA:  General endotracheal anesthesia.   FINDINGS:  Patient had preoperatively possibility of adenomyosis.  She  did not have any evidence of infection.  At time of surgery everything  appeared to be normal, maybe a slightly boggy uterus but no significant  myoma seen, no endometriosis seen as well.   DESCRIPTION OF PROCEDURE:  Patient was taken to the operating room,  placed in the supine position where she underwent general endotracheal  anesthesia.  She was then placed in dorsal lithotomy position, prepped  and draped in the usual sterile fashion.  Circumferential incision was  made about the cervix.  Anterior and posterior vagina were pushed off  the lower uterine segment.  The posterior cul-de-sac was entered  sharply.  The uterosacral ligaments were clamped, cut, and suture  ligated bilaterally.  The anterior cul-de-sac was then entered.  The  cardinal ligaments were clamped, cut, and suture ligated.  The uterine  vessels were clamped, cut, and suture ligated.  Serial pedicles taken up  the cervix, each pedicle being clamped, cut, and suture ligated.  The  utero-ovarian ligaments were then crossclamped, double suture ligated  and the infundibulopelvic ligaments were then crossclamped, double  suture ligated with good hemostasis.  Pelvis  was irrigated vigorously.  All pedicles were found to be hemostatic.  The  peritoneum was reapproximated, was closed using a pursestring fashion.  The vagina was closed anterior to posterior in interrupted fashion  without difficulty.  Patient tolerated the procedure well.  She  experienced 100 mL of blood loss and was taken to the recovery room in  good stable condition.  All counts correct.      [name], M.D.  Electronically Signed     LHE/MEDQ  D:  06/07/2007  T:  06/08/2007  Job:  [id number]

## 2011-02-28 NOTE — Discharge Summary (Signed)
Brittany, Carrillo NO.:  [id number]   MEDICAL RECORD NO.:  [id number]          PATIENT TYPE:  INP   LOCATION:  3034                         FACILITY:  [facility]   PHYSICIAN:  Madaline Guthrie, M.D.    DATE OF BIRTH:  [birth year]   DATE OF ADMISSION:  03/06/2009  DATE OF DISCHARGE:  03/09/2009                               DISCHARGE SUMMARY   DISCHARGE DIAGNOSES:  1. Severe constipation, most likely secondary to medication induced,      resolved, starting bowel regimen consisting of Colace, MiraLax and      Senokot, to follow up with her primary care physician.  2. Bipolar disorder.  3. Anxiety/depression.  4. Abnormal liver function tests, hepatitis panel negative, unclear      etiology, ultrasound of the abdomen negative, resolving, to follow      up with her primary care physician.  5. Ongoing tobacco abuse.  6. History of dysmenorrhea, menometrorrhagia, status post vaginal      hysterectomy and bilateral salpingo-oophorectomy in August 2008.  7. History of intentional drug overdose, requiring Behavioral Health      admission, no suicidal or homicidal ideation at this time.  8. Gastroesophageal reflux disease.  9. Gout, on allopurinol.   DISCHARGE MEDICATIONS:  1. Allopurinol 300 mg 1 pill once daily.  2. Xanax 1 mg 1 pill 3 times daily.  3. Colace 100 mg 1 pill twice daily.  4. Cymbalta 30 mg 1 pill once daily.  5. MiraLax 17 g once daily, mix it in juice or water as needed for      constipation.  6. Senokot 1 tablet once daily at bedtime.  7. Trazodone 100 mg 1 pill once daily.  8. Dulcolax rectal suppositories as needed for constipation.  9. Prilosec 20 mg 1 pill twice daily.  10.The patient is advised not to take Lasix, Seroquel, Flexeril until      she sees her primary care physician.  11.The patient is advised to drink plenty of water and eat fruits and      vegetables.   DISPOSITION AND FOLLOWUP:  The patient is to follow up with her primary  care  physician, Dr. Loney Hering on March 22, 2009, at 12 p.m.  At the time of  hospital followup, please address her constipation.  Given her severe  constipation is related to her psychiatric medications, if possible  please try to readjust her psychiatric medications.  We held her  Seroquel during the hospital admission.  Alto at the time of hospital  followup, please check her liver function tests given her abnormal liver  function tests on admission that were resolving during the remaining  part of the hospital stay.   PROCEDURES PERFORMED:  1. Abdominal x-ray.  Impression, scattered air-fluid levels without      apparent obstruction or significant stool burden.  2. Ultrasound abdomen.  Impression:  A.  Surgically absent gallbladder.  B.  No hydronephrosis or diagnostic renal calculus.  C.  Normal CBD.  D.  Probable fatty infiltration of the liver.  1. Hepatitis panel is negative for hepatitis B surface antigen,  hepatitis B core antibody, hepatitis C antibody, hepatitis E      antibody.  Urine cultures negative.  Serum ferritin 45, serum      lipase is 17, TSH is 0.805, free T4 is 1.0, hemoglobin A1c is 5.5.   CONSULTATIONS:  None.   BRIEF ADMITTING HISTORY AND PHYSICAL:  The patient is a 40 year old lady with past medical history significant  for bipolar disorder, anxiety, depression, who was transferred from  [facility] in [place].  The patient initially presented to  [facility] Emergency Room with severe constipation.  Apparently, the  patient's complaints started nearly 4 months ago, before which she used  to have regular bowel movements daily.  Her constipation is gradually  getting worse over the last 4 months and her last bowel movement was 3  weeks ago.  She has been seen by Dr. Karilyn Cota (GI) and has been tried with  different medication regimen including Dulcolax, MiraLax, Amitiza, and  nothing worked.  Night prior to admission, she had nausea and vomited   greenish-colored vomitus with no blood in it.  She decided to go to the  emergency at that time.  She complains of abdominal pain, diffuse, 7/10  in severity, nonradiating, gradually increasing, no aggravating or  relieving factors.  She denies any other complaints.  The patient is  allergic to SULFA DRUGS.   PHYSICAL EXAMINATION:  VITAL SIGNS:  Temperature 98.0, blood pressure  129/88, pulse rate of 90, respiratory rate 18, oxygen saturation 95% on  2 L.  GENERAL:  The patient is not in any acute distress.  HEENT:  Pupils equal, round, and reactive to light.  Extraocular  movements are intact.  Moist mucous membranes.  No thrush or erythema.  NECK:  Supple.  No thyromegaly.  RESPIRATORY:  Good air entry bilaterally.  Fine wheezing bilaterally  diffusely.  No crackles.  No rhonchi.  CARDIOVASCULAR:  S1, S2, regular in rate and rhythm.  No murmurs.  No  rubs.  No gallops.  ABDOMINAL:  Abdomen is soft and distended.  Diffuse tenderness to  palpation all over the abdomen, but there is no guarding, no rigidity.  Bowel sounds are positive.  RECTAL:  No fecal impaction.  No stool or blood in the rectal vault.  Fecal occult blood test is negative.  EXTREMITIES:  No pedal edema.  GENITOURINARY:  No CVA tenderness.  NEUROLOGICAL:  The patient is alert and oriented x3.  Cranial nerves II-  XII are grossly intact.  Sensation is intact.  Motor strength is intact,  5/5 bilaterally.  PSYCH:  Appropriate.   CBC on admission, white count of 8.4, hemoglobin 13.6, platelet count  806.  BMP, sodium of 140, potassium 4.6, chloride 108, bicarb 30,  glucose 112, BUN 4, creatinine 0.63, bilirubin 1.1, alkaline phosphatase  138, AST 440, ALT 320, total protein 6.0, albumin 3.3, calcium 8.9.   HOSPITAL COURSE:  1. Severe constipation, most likely secondary to her psychiatric      medications including Xanax, Cymbalta, Seroquel, and trazodone.      Initially, we continued her Xanax, but held her other  medications      including Seroquel, trazodone, and Cymbalta.  We gave Fleet Enema      as well as gave MiraLax and Colace.  The patient had 5 big bowel      movements on the day of admission and continued to have at least 2-      3 regular bowel movements during the rest of the  admission.  The      patient's Hemoccult is negative. We called her primary care      physician Dr. Loney Hering and after discussing with him, as per his      recommendations, we held her Seroquel and continued the rest of the      medications.  The patient's abdominal pain and complaints gradually      resolved during the rest of the hospital stay.  The patient is to      take the bowel regimen mentioned in the discharge instructions and      is to follow up with her primary care physician, Dr. Loney Hering on March 22, 2009, at 12 p.m.  The patient is also advised to start doing      physical activity as well as advised to drink plenty of water and      eat fruits and vegetables.   1. Bipolar disorder.  The patient did not have any psychiatric issues      during the hospital stay.  We held her Cymbalta, trazodone, and      Seroquel during the initial 2 days of admission and then restarted      Cymbalta and trazodone.  The patient is to follow up with her      primary care physician.  2. Depression.  The patient did not have any suicidal or homicidal      ideation during this admission.  We held her medications during the      initial 2 days of admission and then restarted Cymbalta and      trazodone.  The patient is to follow up with her primary care      physician.   DISCHARGE VITAL SIGNS:  Temperature 97.6, pulse rate of 76, blood  pressure 124/85, saturation 92% on room air.   DISCHARGE LABORATORY DATA:  CBC, white count 10.5, hemoglobin 14.1,  platelet count 326.  BMP, sodium 142, potassium 2.6, chloride 106,  bicarbonate 28, glucose 106, BUN 6, creatinine 0.69, total bilirubin  0.5, alkaline phosphatase 98, AST  29, ALT 115, total protein 6.4,  albumin 3.3, calcium 9.3, and magnesium is 1.9.   The patient on the day of discharge is completely asymptomatic, stable,  and is not in any acute distress.  The patient is to follow up with her  primary care physician and is advised to take all the medications  mentioned in the discharge instructions.      Blondell Reveal, MD  Electronically Signed      Madaline Guthrie, M.D.  Electronically Signed    VB/MEDQ  D:  03/09/2009  T:  03/10/2009  Job:  [id number]   cc:   Dr. Loney Hering

## 2011-02-28 NOTE — H&P (Signed)
NAMEDENISHA, HOEL               ACCOUNT NO.:  [id number]   MEDICAL RECORD NO.:  [id number]          PATIENT TYPE:  INP   LOCATION:  A335                          FACILITY:  [facility]   PHYSICIAN:  Dorris Singh, DO    DATE OF BIRTH:  [birth year]   DATE OF ADMISSION:  06/23/2008  DATE OF DISCHARGE:  LH                              HISTORY & PHYSICAL   CHIEF COMPLAINT:  Cough.  The patient is a 40 year old Caucasian female  who presented to the [facility] emergency room with a 2-day complaint of  persistent cough that was getting gradually worse.  It was also  characterized by fever, chills, malaise and nasal congestion and  wheezing.  The patient also states that cough caused chest pain as well  and was associated with headache and malaise.  Nothing was making it  better, so she presented to the emergency room.   PAST MEDICAL HISTORY:  She also has history of bronchitis and  depression.   ALLERGY:  SULFA MEDICATIONS.  NO REACTION GIVEN.   SOCIAL HISTORY:  She is a cigarette smoker.  She recently has cut down  to a half pack a day from smoking 1 pack a day for the last month.  She  denies any drug use.  Nondrinker.  She is married.   MEDICATIONS:  1. Cymbalta 60 mg p.o. daily.  2. Ibuprofen as needed.  3. Seroquel 300 mg p.o. daily.  4. Depakote 500 mg p.o. b.i.d.  5. Xanax 1 mg, as needed.   REVIEW OF SYSTEMS:  CONSTITUTIONAL:  Positive for fever and chills.  EYES:  Negative for changes in vision.  EARS, NOSE, MOUTH AND THROAT:  Positive for nasal congestion.  CARDIOVASCULAR:  Negative for chest  pain.  RESPIRATORY:  Positive for cough plus wheeze.  GASTROINTESTINAL:  Positive for abdominal pain.  Negative for any nausea, vomiting,  diarrhea.  GU: Negative for hesitancy or urgency.  MUSCULOSKELETAL:  Positive for back pain, myalgia.  SKIN:  Negative for pruritus.  NEURO:  Negative for syncope or LOC. PSYCHIATRIC:  Positive for depression.  METABOLIC:  Negative for changes,  intolerances to heat and cold.  HEMATOLOGIC:  Negative for anemia.   PHYSICAL EXAMINATION:  VITAL SIGNS:  Temperature is 99.3, blood pressure  100/44,  pulse 97, respirations 20.  GENERAL:  Constitutionally the patient is well-developed, well-  nourished, in no acute distress.  She looks her stated age.  HEENT:  Head is normocephalic, atraumatic.  Eyes are PERRL.  EOMI.  No  conjunctival injection or scleral icterus.  Ears, nose, mouth and  throat, TMs visualized bilaterally.  Positive swollen nasal turbinates.  NECK:  Supple,  nontender.  No meningeal signs.  CARDIOVASCULAR:  Regular rate and rhythm.  RESPIRATORY:  Positive inspiratory and expiratory rhonchi and chest  movement is normal.  There are no retractions noted.  ABDOMEN:  Soft, nontender, nondistended.  Bowel sounds in all 4  quadrants.  GU:  No CVA tenderness.  EXTREMITIES:  Full range of motion.  Positive pulses.  No ecchymosis,  cyanosis or edema.  NEURO:  Cranial nerves 2-12  grossly intact.  The patient has appropriate  lymph nodes.  No palpable lymph nodes noted, in axillary or cervical.   TESTING:  Testing that was done includes a chest x-ray which  demonstrates stable changes or COPD and chronic bronchitis.  No acute  abnormality.  She did not have any labs done in the ED.  We will do labs  here on the floor to make sure we are not dealing with any other issue.   ASSESSMENT AND PLAN:  Exacerbation of chronic obstructive pulmonary  disease, particularly bronchitis, shortness of breath and depression.  We will admit her to the service of [facility] to [facility].  We will try to  keep her oxygenation sats above 90%, give her regular diet, also give  her fluids.  Will do CBC and BMET today and also test for H1N1 if she  has not been tested and influenza.  She currently is on contact  precautions and will continue those as well.  Will do deep venous  thrombosis and gastrointestinal prophylaxis, as well as giving her Solu-   Medrol on an 8  hour schedule, as well as breathing treatments.  Will  put her on her home medications and continue to monitor her and make  changes as necessary.      Dorris Singh, DO  Electronically Signed     CB/MEDQ  D:  06/23/2008  T:  06/23/2008  Job:  [id number]
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
## 2011-06-09 IMAGING — PT NM PET TUM IMG INITIAL (PI) SKULL BASE T - THIGH
6 series · 25 of 25 positions shown · non-contrast
Comparison: [HOSPITAL] abdomen and pelvis CT exam from
06/21/2010

CLINICAL DATA: Initial treatment strategy for lymphoproliferative
syndrome.

NUCLEAR MEDICINE PET CT INITIAL (PI) SKULL BASE TO THIGH
TECHNIQUE: 15.0 mCi F-18 FDG was injected intravenously via the
right wrist.  Full-ring PET imaging was performed from the skull
base through the mid-thighs 93  minutes after injection.  CT data
was obtained and used for attenuation correction and anatomic
localization only.  (This was not acquired as a diagnostic CT
examination.)
Fasting Blood Glucose:  105

[Series 1: pet ac legs · axial · 3.3mm · 4.69mm/px · z∈[+76,+946]mm · 5 of 267 slices shown]
[im 1/267]
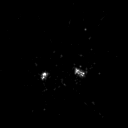
[im 67/267]
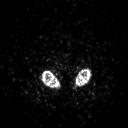
[im 134/267]
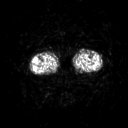
[im 200/267]
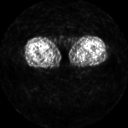
[im 267/267]
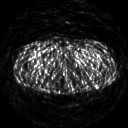

[Series 2: ct legs · axial · 3.8mm · 0.98mm/px · z∈[+76,+946]mm · 6 of 267 slices shown]
[im 1/267]
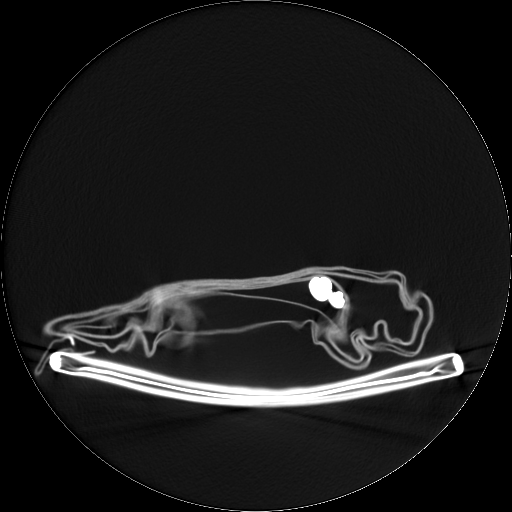
[im 54/267]
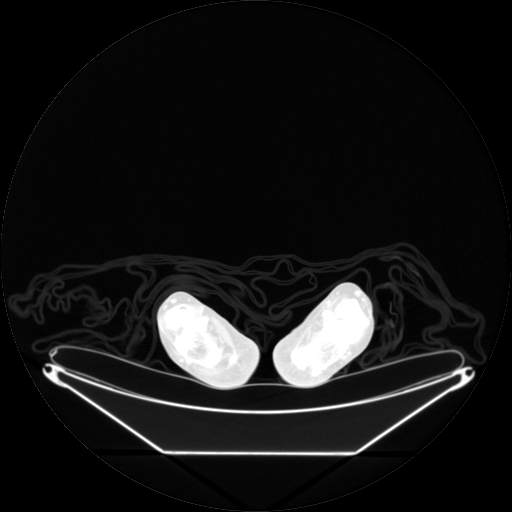
[im 107/267]
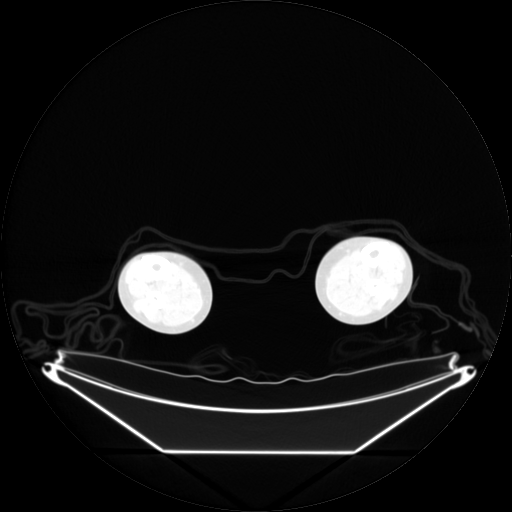
[im 160/267]
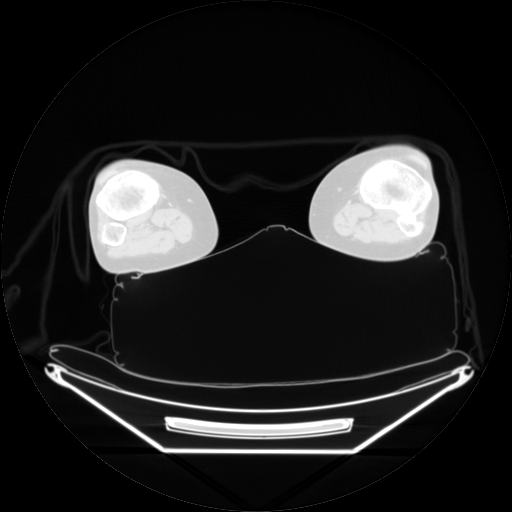
[im 213/267]
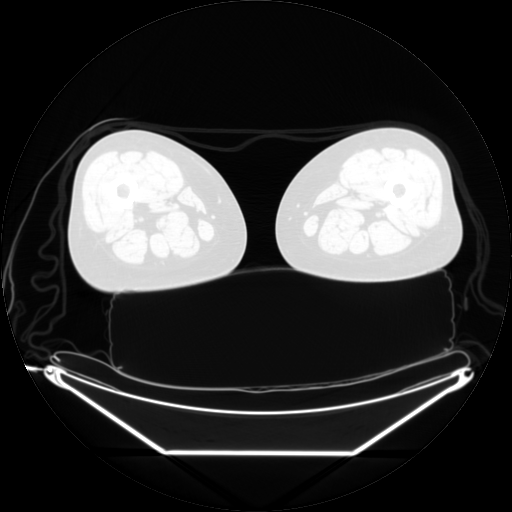
[im 267/267]
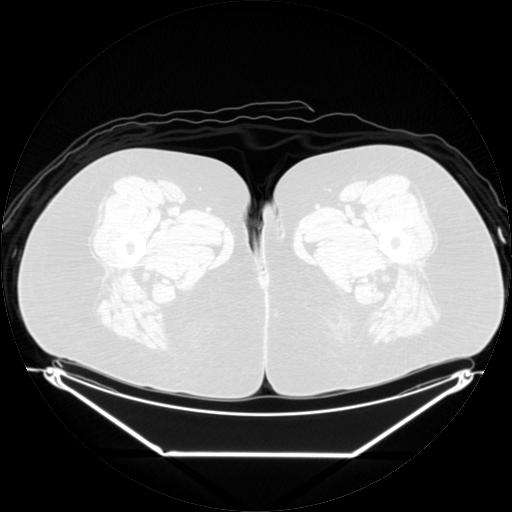

[Series 2: pet nac legs · axial · 3.3mm · 4.69mm/px · z∈[+76,+946]mm · 6 of 267 slices shown]
[im 1/267]
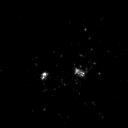
[im 54/267]
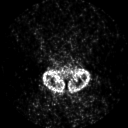
[im 107/267]
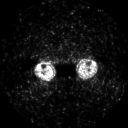
[im 160/267]
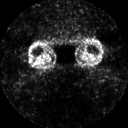
[im 213/267]
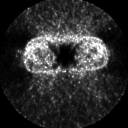
[im 267/267]
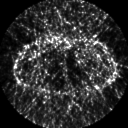

[Series 123: mip · coronal · 3.3mm · 4.69mm/px · 1 of 30 slices shown]
[im 1/30]
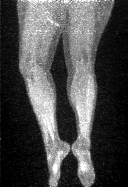

[Series 151: reformatted · axial · 3.3mm · 3.91mm/px · z∈[+76,+946]mm · 6 of 265 slices shown (1 of 2)]
[im 1/265]
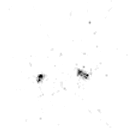
[im 53/265]
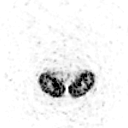
[im 106/265]
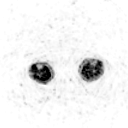
[im 159/265]
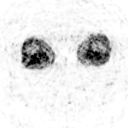
[im 212/265]
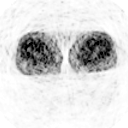
[im 265/265]
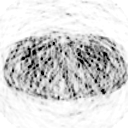

[Series 153: reformatted · coronal · 4.7mm · 6.98mm/px · 1 of 61 slices shown (2 of 2)]
[im 1/61]
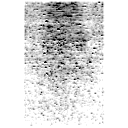

[25 of 25 positions shown; findings below may reference images not displayed]

FINDINGS: There is no abnormal or unexpected F D G accumulation
within the neck, chest, abdomen, or pelvis.

Review of the CT data obtained for attenuation correction shows no
acute findings.  There is mild diffuse fatty infiltration of the
liver.  The patient has had prior fusion at the lumbosacral
junction.  The uterus and the gallbladder are surgically absent.
IMPRESSION: No abnormal or unexpected hypermetabolic activity in the neck,
chest, abdomen, pelvis, or lower extremities.

## 2011-07-13 LAB — DIFFERENTIAL
Basophils Absolute: 0.1
Eosinophils Absolute: 0.4
Eosinophils Relative: 3

## 2011-07-13 LAB — CBC
HCT: 38.1
MCV: 90
Platelets: 285
RDW: 12.6

## 2011-07-13 LAB — URINALYSIS, ROUTINE W REFLEX MICROSCOPIC
Ketones, ur: 15 — AB
Leukocytes, UA: NEGATIVE
Nitrite: NEGATIVE

## 2011-07-13 LAB — BASIC METABOLIC PANEL
BUN: 7
Chloride: 103
GFR calc non Af Amer: >60
Glucose, Bld: 98
Potassium: 3.5

## 2011-07-19 LAB — CBC
HCT: 39.3
Hemoglobin: 12.9
MCHC: 34.4
Platelets: 316
RDW: 13.9
WBC: 14.1 — ABNORMAL HIGH

## 2011-07-19 LAB — PROTIME-INR
INR: 1
Prothrombin Time: 13.2

## 2011-07-19 LAB — DIFFERENTIAL
Basophils Absolute: 0
Basophils Absolute: 0
Basophils Relative: 0
Eosinophils Absolute: 0
Lymphocytes Relative: 6 — ABNORMAL LOW
Lymphs Abs: 0.9
Monocytes Absolute: 0.2
Neutro Abs: 12.8 — ABNORMAL HIGH
Neutro Abs: 6.2
Neutrophils Relative %: 91 — ABNORMAL HIGH

## 2011-07-19 LAB — BASIC METABOLIC PANEL
BUN: 5 — ABNORMAL LOW
CO2: 26
Calcium: 8.7
Calcium: 8.8
Creatinine, Ser: 0.63
Creatinine, Ser: 0.77
GFR calc non Af Amer: >60
Glucose, Bld: 153 — ABNORMAL HIGH
Potassium: 3.8
Sodium: 139

## 2011-07-19 LAB — APTT: aPTT: 29

## 2011-07-31 LAB — COMPREHENSIVE METABOLIC PANEL
ALT: 106 — ABNORMAL HIGH
AST: 17
Albumin: 3.9
CO2: 26
Calcium: 9.3
Chloride: 104
GFR calc Af Amer: >60
GFR calc non Af Amer: >60
Sodium: 137
Total Bilirubin: 0.6

## 2011-07-31 LAB — DIFFERENTIAL
Basophils Absolute: 0.3 — ABNORMAL HIGH
Eosinophils Absolute: 0.6
Eosinophils Relative: 0
Eosinophils Relative: 5
Lymphocytes Relative: 11 — ABNORMAL LOW
Lymphs Abs: 2.9
Lymphs Abs: 3.3
Monocytes Absolute: 1 — ABNORMAL HIGH
Neutro Abs: 22.5 — ABNORMAL HIGH
Neutrophils Relative %: 83 — ABNORMAL HIGH

## 2011-07-31 LAB — CBC
Platelets: 359
Platelets: 395
RBC: 4.73
RDW: 13.2
WBC: 11.9 — ABNORMAL HIGH
WBC: 27.2 — ABNORMAL HIGH

## 2011-07-31 LAB — TYPE AND SCREEN
ABO/RH(D): A POS
Antibody Screen: NEGATIVE

## 2011-07-31 LAB — URINALYSIS, ROUTINE W REFLEX MICROSCOPIC
Protein, ur: NEGATIVE
Specific Gravity, Urine: <1.005 — ABNORMAL LOW
Urobilinogen, UA: 0.2

## 2011-07-31 LAB — ABO/RH: ABO/RH(D): A POS

## 2012-02-08 ENCOUNTER — Ambulatory Visit (HOSPITAL_COMMUNITY)
Admission: RE | Admit: 2012-02-08 | Discharge: 2012-02-08 | Disposition: A | Discharge status: Home / Self Care | Payer: Medicaid Other | Source: Ambulatory Visit | Attending: Family Medicine | Admitting: Family Medicine

## 2012-02-08 ENCOUNTER — Other Ambulatory Visit: Payer: Self-pay | Admitting: Neurology

## 2012-02-08 ENCOUNTER — Other Ambulatory Visit (HOSPITAL_COMMUNITY): Payer: Self-pay | Admitting: *Deleted

## 2012-02-08 DIAGNOSIS — M25559 Pain in unspecified hip: Secondary | ICD-10-CM | POA: Insufficient documentation

## 2012-02-08 DIAGNOSIS — W19XXXA Unspecified fall, initial encounter: Secondary | ICD-10-CM | POA: Insufficient documentation

## 2012-03-12 ENCOUNTER — Ambulatory Visit: Payer: Medicare Other | Attending: Urology | Admitting: Physical Therapy

## 2012-03-12 DIAGNOSIS — R5381 Other malaise: Secondary | ICD-10-CM | POA: Insufficient documentation

## 2012-03-12 DIAGNOSIS — M629 Disorder of muscle, unspecified: Secondary | ICD-10-CM | POA: Insufficient documentation

## 2012-03-12 DIAGNOSIS — M2420 Disorder of ligament, unspecified site: Secondary | ICD-10-CM | POA: Insufficient documentation

## 2012-03-12 DIAGNOSIS — IMO0001 Reserved for inherently not codable concepts without codable children: Secondary | ICD-10-CM | POA: Insufficient documentation

## 2012-03-26 ENCOUNTER — Encounter: Payer: Medicare Other | Admitting: Physical Therapy

## 2012-04-02 ENCOUNTER — Encounter: Payer: Medicare Other | Admitting: Physical Therapy

## 2012-04-04 ENCOUNTER — Encounter: Payer: Medicare Other | Admitting: Physical Therapy

## 2012-04-09 ENCOUNTER — Encounter: Payer: Medicare Other | Admitting: Physical Therapy

## 2012-04-11 ENCOUNTER — Encounter: Payer: Medicare Other | Admitting: Physical Therapy

## 2012-04-16 ENCOUNTER — Encounter: Payer: Medicare Other | Admitting: Physical Therapy

## 2012-04-23 ENCOUNTER — Encounter: Payer: Medicare Other | Admitting: Physical Therapy

## 2012-04-25 ENCOUNTER — Encounter: Payer: Medicare Other | Admitting: Physical Therapy

## 2012-06-27 ENCOUNTER — Telehealth: Payer: Self-pay | Admitting: Cardiology

## 2012-06-27 NOTE — Telephone Encounter (Signed)
Received return mail.  Updated address from [address]  to [address] [place].

## 2014-10-30 ENCOUNTER — Encounter (HOSPITAL_COMMUNITY): Payer: Self-pay | Admitting: *Deleted

## 2014-10-30 ENCOUNTER — Emergency Department (HOSPITAL_COMMUNITY)
Admission: EM | Admit: 2014-10-30 | Discharge: 2014-10-30 | Disposition: A | Discharge status: Home / Self Care | Payer: Medicare Other | Attending: Emergency Medicine | Admitting: Emergency Medicine

## 2014-10-30 ENCOUNTER — Emergency Department (HOSPITAL_COMMUNITY): Payer: Medicare Other

## 2014-10-30 DIAGNOSIS — G629 Polyneuropathy, unspecified: Secondary | ICD-10-CM | POA: Diagnosis not present

## 2014-10-30 DIAGNOSIS — Y9289 Other specified places as the place of occurrence of the external cause: Secondary | ICD-10-CM | POA: Diagnosis not present

## 2014-10-30 DIAGNOSIS — S79912A Unspecified injury of left hip, initial encounter: Secondary | ICD-10-CM | POA: Diagnosis not present

## 2014-10-30 DIAGNOSIS — I10 Essential (primary) hypertension: Secondary | ICD-10-CM | POA: Diagnosis not present

## 2014-10-30 DIAGNOSIS — J449 Chronic obstructive pulmonary disease, unspecified: Secondary | ICD-10-CM | POA: Diagnosis not present

## 2014-10-30 DIAGNOSIS — S7012XA Contusion of left thigh, initial encounter: Secondary | ICD-10-CM | POA: Insufficient documentation

## 2014-10-30 DIAGNOSIS — Y9389 Activity, other specified: Secondary | ICD-10-CM | POA: Diagnosis not present

## 2014-10-30 DIAGNOSIS — Z720 Tobacco use: Secondary | ICD-10-CM | POA: Diagnosis not present

## 2014-10-30 DIAGNOSIS — S4992XA Unspecified injury of left shoulder and upper arm, initial encounter: Secondary | ICD-10-CM | POA: Insufficient documentation

## 2014-10-30 DIAGNOSIS — Z79899 Other long term (current) drug therapy: Secondary | ICD-10-CM | POA: Diagnosis not present

## 2014-10-30 DIAGNOSIS — S59902A Unspecified injury of left elbow, initial encounter: Secondary | ICD-10-CM | POA: Diagnosis present

## 2014-10-30 DIAGNOSIS — M797 Fibromyalgia: Secondary | ICD-10-CM | POA: Insufficient documentation

## 2014-10-30 DIAGNOSIS — W010XXA Fall on same level from slipping, tripping and stumbling without subsequent striking against object, initial encounter: Secondary | ICD-10-CM | POA: Insufficient documentation

## 2014-10-30 DIAGNOSIS — W19XXXA Unspecified fall, initial encounter: Secondary | ICD-10-CM

## 2014-10-30 DIAGNOSIS — F319 Bipolar disorder, unspecified: Secondary | ICD-10-CM | POA: Insufficient documentation

## 2014-10-30 DIAGNOSIS — Y998 Other external cause status: Secondary | ICD-10-CM | POA: Diagnosis not present

## 2014-10-30 DIAGNOSIS — S5002XA Contusion of left elbow, initial encounter: Secondary | ICD-10-CM | POA: Insufficient documentation

## 2014-10-30 DIAGNOSIS — Z8572 Personal history of non-Hodgkin lymphomas: Secondary | ICD-10-CM | POA: Insufficient documentation

## 2014-10-30 HISTORY — DX: Essential (primary) hypertension: I10

## 2014-10-30 HISTORY — DX: Chronic obstructive pulmonary disease, unspecified: J44.9

## 2014-10-30 HISTORY — DX: Malignant (primary) neoplasm, unspecified: C80.1

## 2014-10-30 HISTORY — DX: Depression, unspecified: F32.A

## 2014-10-30 HISTORY — DX: Other specified postprocedural states: Z98.890

## 2014-10-30 HISTORY — DX: Age-related osteoporosis without current pathological fracture: M81.0

## 2014-10-30 HISTORY — DX: Major depressive disorder, single episode, unspecified: F32.9

## 2014-10-30 HISTORY — DX: Other cervical disc degeneration, unspecified cervical region: M50.30

## 2014-10-30 HISTORY — DX: Bipolar disorder, unspecified: F31.9

## 2014-10-30 HISTORY — DX: Fibromyalgia: M79.7

## 2014-10-30 MED ORDER — ONDANSETRON HCL 4 MG/2ML IJ SOLN
4.0000 mg | Freq: Once | INTRAMUSCULAR | Status: AC
  Administered 2014-10-30: 4 mg via INTRAVENOUS
  Filled 2014-10-30: qty 2

## 2014-10-30 MED ORDER — HYDROMORPHONE HCL 1 MG/ML IJ SOLN
1.0000 mg | Freq: Once | INTRAMUSCULAR | Status: AC
  Administered 2014-10-30: 1 mg via INTRAVENOUS
  Filled 2014-10-30: qty 1

## 2014-10-30 NOTE — ED Provider Notes (Signed)
CSN: [id number]     Arrival date & time 10/30/14  1555 History   First MD Initiated Contact with Patient 10/30/14 1648     Chief Complaint  Patient presents with  . Fall     (Consider location/radiation/quality/duration/timing/severity/associated sxs/prior Treatment) HPI  44 year old female presents after falling on concrete. This occurred 3 hours ago. 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 She thinks she tripped on her feet and fell and landed on her left side. She injured her left elbow as well as her left hip. She rates the pain as severe. No weakness or numbness. Did not hit her head or neck. No other injuries. There was no dizziness or syncope.  Past Medical History  Diagnosis Date  . Cancer     lymphoma  . DDD (degenerative disc disease), cervical   . Fibromyalgia   . Osteoporosis   . History of back surgery   . Bipolar 1 disorder   . Depression   . Hypertension   . COPD (chronic obstructive pulmonary disease)    Past Surgical History  Procedure Laterality Date  . Back surgery    . Skin biopsy    . Abdominal hysterectomy    . Cholecystectomy     History reviewed. No pertinent family history. History  Substance Use Topics  . Smoking status: Current Every Day Smoker -- 1.00 packs/day    Types: Cigarettes  . Smokeless tobacco: Not on file  . Alcohol Use: No   OB History    No data available     Review of Systems  Musculoskeletal: Positive for arthralgias.  Neurological: Negative for syncope, weakness, numbness and headaches.  All other systems reviewed and are negative.     Allergies  Haldol and Sulfa antibiotics  Home Medications   Prior to Admission medications   Medication Sig Start Date End Date Taking? Authorizing Provider  albuterol (PROVENTIL HFA;VENTOLIN HFA) 108 (90 BASE) MCG/ACT inhaler Inhale 1 puff into the lungs 4 (four) times daily. 04/19/13  Yes Historical Provider, MD   albuterol (PROVENTIL) (2.5 MG/3ML) 0.083% nebulizer solution Inhale 2.5 mg into the lungs 2 (two) times daily as needed for wheezing or shortness of breath.    Yes Historical Provider, MD  allopurinol (ZYLOPRIM) 300 MG tablet Take 300 mg by mouth daily. 02/14/11  Yes Historical Provider, MD  ALPRAZolam Duanne Moron) 1 MG tablet Take 1 mg by mouth 3 (three) times daily. 02/21/11  Yes Historical Provider, MD  butalbital-acetaminophen-caffeine (FIORICET, ESGIC) 50-325-40 MG per tablet Take 1 tablet by mouth daily as needed. For migraine pain 06/24/13  Yes Historical Provider, MD  cyclobenzaprine (FLEXERIL) 10 MG tablet Take 20 mg by mouth at bedtime. 10/21/14  Yes Historical Provider, MD  gabapentin (NEURONTIN) 300 MG capsule Take 300 mg by mouth 3 (three) times daily. 09/22/14 09/22/15 Yes Historical Provider, MD  Linaclotide Rolan Lipa) 145 MCG CAPS capsule Take 145 mcg by mouth daily.   Yes Historical Provider, MD  lisinopril-hydrochlorothiazide (PRINZIDE,ZESTORETIC) 10-12.5 MG per tablet Take 1 tablet by mouth daily.   Yes Historical Provider, MD  omeprazole (PRILOSEC) 20 MG capsule Take 20 mg by mouth 2 (two) times daily. 02/14/11  Yes Historical Provider, MD  oxyCODONE-acetaminophen (PERCOCET) 10-325 MG per tablet Take 1 tablet by mouth every 6 (six) hours. 10/20/14  Yes Historical Provider, MD  potassium chloride (K-DUR,KLOR-CON) 10 MEQ tablet Take 10 mEq by mouth daily. 04/30/14 04/30/15 Yes Historical Provider, MD  promethazine (PHENERGAN) 25 MG tablet  Take 1 tablet by mouth every 6 (six) hours as needed.   Yes Historical Provider, MD  tiotropium (SPIRIVA) 18 MCG inhalation capsule Place 1 Inhaler into inhaler and inhale daily. 02/21/11  Yes Historical Provider, MD  topiramate (TOPAMAX) 50 MG tablet Take 50 mg by mouth daily. 10/23/14  Yes Historical Provider, MD  traZODone (DESYREL) 100 MG tablet Take 100 mg by mouth at bedtime.   Yes Historical Provider, MD  Vilazodone HCl (VIIBRYD) 40 MG TABS Take 40 mg by mouth  daily. 12/14/12  Yes Historical Provider, MD  ziprasidone (GEODON) 20 MG capsule Take 20 mg by mouth daily.   Yes Historical Provider, MD  azithromycin (ZITHROMAX) 500 MG tablet Take 500 mg by mouth daily. 3 day course starting on 10/23/14. COURSE COMPLETED 10/23/14   Historical Provider, MD  predniSONE (DELTASONE) 20 MG tablet Take 20 mg by mouth as directed. COMPLETED COURSE ON 10/28/2014 10/23/14   Historical Provider, MD   BP 111/67 mmHg  Pulse 96  Temp(Src) 99.1 F (37.3 C) (Oral)  Resp 18  Ht 5\' 8"  (1.727 m)  Wt 208 lb 3 oz (94.433 kg)  BMI 31.66 kg/m2  SpO2 100% Physical Exam  Constitutional: She is oriented to person, place, and time. She appears well-developed and well-nourished.  HENT:  Head: Normocephalic and atraumatic.  Right Ear: External ear normal.  Left Ear: External ear normal.  Nose: Nose normal.  Eyes: Right eye exhibits no discharge. Left eye exhibits no discharge.  Cardiovascular: Normal rate and intact distal pulses.   Pulses:      Radial pulses are 2+ on the right side, and 2+ on the left side.  Pulmonary/Chest: Effort normal.  Abdominal: Soft. She exhibits no distension. There is no tenderness.  Musculoskeletal:       Left elbow: She exhibits decreased range of motion and swelling. Tenderness found.       Left wrist: She exhibits normal range of motion and no tenderness.       Left hip: She exhibits tenderness (over left greater trochanter). She exhibits normal range of motion.       Left upper arm: She exhibits tenderness (distal, mild).       Left forearm: She exhibits tenderness (mild, proximal next to elbow joint).  Normal strength and sensation in all 4 extremities.   Neurological: She is alert and oriented to person, place, and time.  Skin: Skin is warm and dry.  Nursing note and vitals reviewed.   ED Course  Procedures (including critical care time) Labs Review Labs Reviewed - No data to display  Imaging Review Dg Pelvis 1-2 Views  10/30/2014    CLINICAL DATA:  Fall today on cement.  Initial encounter.  EXAM: PELVIS - 1-2 VIEW  COMPARISON:  02/08/2012  FINDINGS: Postoperative changes in the lower lumbar spine. Degenerative changes in the hips bilaterally with spurring. No acute bony abnormality. Specifically, no fracture, subluxation, or dislocation. Soft tissues are intact. SI joints are symmetric and unremarkable.  IMPRESSION: No acute bony abnormality.   Electronically Signed   By: Rolm Baptise M.D.   On: 10/30/2014 18:37   Dg Elbow Complete Left  10/30/2014   CLINICAL DATA:  Current history of lymphoma for which the patient is undergoing chemotherapy and has peripheral neuropathy. Patient fell earlier today and injured the left elbow on a sidewalk. Pain involving the elbow and the distal 1/3 of the left humerus. Initial encounter.  EXAM: LEFT ELBOW - COMPLETE 3+ VIEW  COMPARISON:  None.  FINDINGS:  No evidence of acute fracture or dislocation. Well preserved joint spaces. Well preserved bone mineral density. Calcification adjacent to the coronoid process of the ulna is likely ligamentous. No posterior fat pad sign to suggest joint effusion or hemarthrosis.  IMPRESSION: No acute or significant osseous abnormality.   Electronically Signed   By: Evangeline Dakin M.D.   On: 10/30/2014 18:38   Dg Humerus Left  10/30/2014   CLINICAL DATA:  Status post fall with 2 p.m. today onto the left arm. Left upper arm pain. Initial encounter.  EXAM: LEFT HUMERUS - 2+ VIEW  COMPARISON:  PET CT scan 06/27/2010  FINDINGS: There is no acute bony or joint abnormality. Small chondroid lesion in the humeral head is consistent with a benign enchondroma and unchanged.  IMPRESSION: No acute abnormality.   Electronically Signed   By: Inge Rise M.D.   On: 10/30/2014 18:36   Dg Femur Min 2 Views Left  10/30/2014   CLINICAL DATA:  Initial encounter for fall around 2 p.m. on cement. Lymphoma, status post chemotherapy.  EXAM: DG FEMUR 2+V*L*  COMPARISON:  None.   FINDINGS: Surgical clips in the right hemipelvis. Mild hip osteoarthritis with osteophyte formation about the femoral head. No knee joint effusion.  IMPRESSION: No acute osseous abnormality.   Electronically Signed   By: Abigail Miyamoto M.D.   On: 10/30/2014 18:39     EKG Interpretation None      MDM   Final diagnoses:  Fall, initial encounter  Left elbow contusion, initial encounter  Thigh contusion, left, initial encounter    Xrays show no fracture. Pain has improved with IV pain meds, ready for discharge. NV intact. Has pain meds at home and does not need more per her.    Ephraim Hamburger, MD 10/30/14 [id number]

## 2014-10-30 NOTE — Discharge Instructions (Signed)
Contusion A contusion is a deep bruise. Contusions are the result of an injury that caused bleeding under the skin. The contusion may turn blue, purple, or yellow. Minor injuries will give you a painless contusion, but more severe contusions may stay painful and swollen for a few weeks.  CAUSES  A contusion is usually caused by a blow, trauma, or direct force to an area of the body. SYMPTOMS   Swelling and redness of the injured area.  Bruising of the injured area.  Tenderness and soreness of the injured area.  Pain. DIAGNOSIS  The diagnosis can be made by taking a history and physical exam. An X-ray, CT scan, or MRI may be needed to determine if there were any associated injuries, such as fractures. TREATMENT  Specific treatment will depend on what area of the body was injured. In general, the best treatment for a contusion is resting, icing, elevating, and applying cold compresses to the injured area. Over-the-counter medicines may also be recommended for pain control. Ask your caregiver what the best treatment is for your contusion. HOME CARE INSTRUCTIONS   Put ice on the injured area.  Put ice in a plastic bag.  Place a towel between your skin and the bag.  Leave the ice on for 15-20 minutes, 3-4 times a day, or as directed by your health care provider.  Only take over-the-counter or prescription medicines for pain, discomfort, or fever as directed by your caregiver. Your caregiver may recommend avoiding anti-inflammatory medicines (aspirin, ibuprofen, and naproxen) for 48 hours because these medicines may increase bruising.  Rest the injured area.  If possible, elevate the injured area to reduce swelling. SEEK IMMEDIATE MEDICAL CARE IF:   You have increased bruising or swelling.  You have pain that is getting worse.  Your swelling or pain is not relieved with medicines. MAKE SURE YOU:   Understand these instructions.  Will watch your condition.  Will get help right  away if you are not doing well or get worse. Document Released: 07/12/2005 Document Revised: 10/07/2013 Document Reviewed: 08/07/2011 [facility] Patient Information 2015 [place], [place]. This information is not intended to replace advice given to you by your health care provider. Make sure you discuss any questions you have with your health care provider.    Elbow Contusion An elbow contusion is a deep bruise of the elbow. Contusions are the result of an injury that caused bleeding under the skin. The contusion may turn blue, purple, or yellow. Minor injuries will give you a painless contusion, but more severe contusions may stay painful and swollen for a few weeks.  CAUSES  An elbow contusion comes from a direct force to that area, such as falling on the elbow. SYMPTOMS   Swelling and redness of the elbow.  Bruising of the elbow area.  Tenderness or soreness of the elbow. DIAGNOSIS  You will have a physical exam and will be asked about your history. You may need an X-ray of your elbow to look for a broken bone (fracture).  TREATMENT  A sling or splint may be needed to support your injury. Resting, elevating, and applying cold compresses to the elbow area are often the best treatments for an elbow contusion. Over-the-counter medicines may also be recommended for pain control. HOME CARE INSTRUCTIONS   Put ice on the injured area.  Put ice in a plastic bag.  Place a towel between your skin and the bag.  Leave the ice on for 15-20 minutes, 03-04 times a day.  Only take over-the-counter or prescription medicines for pain, discomfort, or fever as directed by your caregiver.  Rest your injured elbow until the pain and swelling are better.  Elevate your elbow to reduce swelling.  Apply a compression wrap as directed by your caregiver. This can help reduce swelling and motion. You may remove the wrap for sleeping, showers, and baths. If your fingers become numb, cold, or blue, take the  wrap off and reapply it more loosely.  Use your elbow only as directed by your caregiver. You may be asked to do range of motion exercises. Do them as directed.  See your caregiver as directed. It is very important to keep all follow-up appointments in order to avoid any long-term problems with your elbow, including chronic pain or inability to move your elbow normally. SEEK IMMEDIATE MEDICAL CARE IF:   You have increased redness, swelling, or pain in your elbow.  Your swelling or pain is not relieved with medicines.  You have swelling of the hand and fingers.  You are unable to move your fingers or wrist.  You begin to lose feeling in your hand or fingers.  Your fingers or hand become cold or blue. MAKE SURE YOU:   Understand these instructions.  Will watch your condition.  Will get help right away if you are not doing well or get worse. Document Released: 09/10/2006 Document Revised: 12/25/2011 Document Reviewed: 08/18/2011 [facility] Patient Information 2015 [place], [place]. This information is not intended to replace advice given to you by your health care provider. Make sure you discuss any questions you have with your health care provider.   Iliac Crest Contusion An iliac crest contusion is a deep bruise of your hip bone (hip pointer). Contusions are the result of an injury that caused bleeding under the skin. The contusion may turn blue, purple, or yellow. Minor injuries will give you a painless contusion, but more severe contusions may stay painful and swollen for a few weeks.  CAUSES  An iliac crest contusion is usually caused by a blow to the top of your hip pointer. The injury most often comes from contact sports.  SYMPTOMS   Swelling and redness of the hip area.  Bruising of the hip area.  Tenderness or soreness of the hip. DIAGNOSIS  The diagnosis can be made by taking your history and doing a physical exam. You may need an X-ray of your hip pointer to look for a  broken bone (fracture). TREATMENT  Often, the best treatment for an iliac crest contusion is resting, icing, elevating, and applying cold compresses to the injured area. Over-the-counter medicines may also be recommended for pain control. Crutches may be used if walking is very painful. Some people need physical therapy to help with range of motion and strengthening.  HOME CARE INSTRUCTIONS   Put ice on the injured area.  Put ice in a plastic bag.  Place a towel between your skin and the bag.  Leave the ice on for 15-20 minutes, 03-04 times a day.  Only take over-the-counter or prescription medicines for pain, discomfort, or fever as directed by your caregiver. Your caregiver may recommend avoiding anti-inflammatory medicines (aspirin, ibuprofen, and naproxen) for 48 hours because these medicines may increase bruising.  Keep your leg straightened (extended) when possible.  Walk or move around as the pain allows, or as directed by your caregiver. Use crutches if your caregiver recommends them.  Apply compression wraps as directed by your caregiver. You may remove the wraps for  sleeping, showers, and baths. SEEK IMMEDIATE MEDICAL CARE IF:   You have increased bruising or swelling.  You have pain that is getting worse.  Your swelling or pain is not relieved with medicines.  Your toes get cold. MAKE SURE YOU:   Understand these instructions.  Will watch your condition.  Will get help right away if you are not doing well or get worse. Document Released: 06/27/2001 Document Revised: 12/25/2011 Document Reviewed: 08/18/2011 [facility] Patient Information 2015 [place], [place]. This information is not intended to replace advice given to you by your health care provider. Make sure you discuss any questions you have with your health care provider.

## 2014-10-30 NOTE — ED Notes (Signed)
Pt fell today, has neuropathy from chemo, legs tripped her up, fell on concrete on lt elbow, and lt hip, also co back pain.

## 2015-06-17 DEATH — deceased
